# Patient Record
Sex: Male | Born: 1955 | Race: White | Hispanic: No | Marital: Single | Smoking: Current every day smoker
Health system: Southern US, Community
[De-identification: ages and names within clinical notes are randomized; demographics above are authoritative.]

## PROBLEM LIST (undated history)

## (undated) DIAGNOSIS — I10 Essential (primary) hypertension: Secondary | ICD-10-CM

---

## 1998-12-15 ENCOUNTER — Emergency Department (HOSPITAL_COMMUNITY): Admission: EM | Admit: 1998-12-15 | Discharge: 1998-12-15 | Payer: Self-pay | Admitting: Emergency Medicine

## 2008-10-22 ENCOUNTER — Emergency Department (HOSPITAL_COMMUNITY): Admission: EM | Admit: 2008-10-22 | Discharge: 2008-10-22 | Payer: Self-pay | Admitting: Emergency Medicine

## 2010-07-26 LAB — URINALYSIS, ROUTINE W REFLEX MICROSCOPIC
Bilirubin Urine: NEGATIVE
Ketones, ur: NEGATIVE mg/dL
Protein, ur: NEGATIVE mg/dL
Urobilinogen, UA: 0.2 mg/dL (ref 0.0–1.0)

## 2010-07-26 LAB — POCT I-STAT, CHEM 8
BUN: 16 mg/dL (ref 6–23)
Calcium, Ion: 1.03 mmol/L — ABNORMAL LOW (ref 1.12–1.32)
Chloride: 99 mEq/L (ref 96–112)
Creatinine, Ser: 1.1 mg/dL (ref 0.4–1.5)
Glucose, Bld: 115 mg/dL — ABNORMAL HIGH (ref 70–99)
HCT: 44 % (ref 39.0–52.0)
Hemoglobin: 15 g/dL (ref 13.0–17.0)
Potassium: 4.1 mEq/L (ref 3.5–5.1)
Sodium: 137 mEq/L (ref 135–145)
TCO2: 29 mmol/L (ref 0–100)

## 2010-07-26 LAB — URINE MICROSCOPIC-ADD ON

## 2013-02-22 ENCOUNTER — Other Ambulatory Visit (HOSPITAL_COMMUNITY): Payer: Self-pay | Admitting: Urology

## 2013-03-05 ENCOUNTER — Ambulatory Visit (HOSPITAL_COMMUNITY)
Admission: RE | Admit: 2013-03-05 | Discharge: 2013-03-05 | Disposition: A | Discharge status: Home / Self Care | Payer: No Typology Code available for payment source | Source: Ambulatory Visit | Attending: Urology | Admitting: Urology

## 2013-03-05 DIAGNOSIS — N289 Disorder of kidney and ureter, unspecified: Secondary | ICD-10-CM | POA: Insufficient documentation

## 2014-06-08 ENCOUNTER — Emergency Department (HOSPITAL_COMMUNITY)
Admission: EM | Admit: 2014-06-08 | Discharge: 2014-06-08 | Disposition: A | Discharge status: Home / Self Care | Payer: PRIVATE HEALTH INSURANCE | Attending: Emergency Medicine | Admitting: Emergency Medicine

## 2014-06-08 ENCOUNTER — Emergency Department (HOSPITAL_COMMUNITY): Payer: PRIVATE HEALTH INSURANCE

## 2014-06-08 ENCOUNTER — Encounter (HOSPITAL_COMMUNITY): Payer: Self-pay | Admitting: *Deleted

## 2014-06-08 DIAGNOSIS — H9201 Otalgia, right ear: Secondary | ICD-10-CM | POA: Insufficient documentation

## 2014-06-08 DIAGNOSIS — I10 Essential (primary) hypertension: Secondary | ICD-10-CM | POA: Insufficient documentation

## 2014-06-08 DIAGNOSIS — Z79899 Other long term (current) drug therapy: Secondary | ICD-10-CM | POA: Insufficient documentation

## 2014-06-08 DIAGNOSIS — R05 Cough: Secondary | ICD-10-CM

## 2014-06-08 DIAGNOSIS — J0100 Acute maxillary sinusitis, unspecified: Secondary | ICD-10-CM

## 2014-06-08 DIAGNOSIS — R059 Cough, unspecified: Secondary | ICD-10-CM

## 2014-06-08 DIAGNOSIS — Z720 Tobacco use: Secondary | ICD-10-CM | POA: Insufficient documentation

## 2014-06-08 DIAGNOSIS — Z7951 Long term (current) use of inhaled steroids: Secondary | ICD-10-CM | POA: Insufficient documentation

## 2014-06-08 HISTORY — DX: Essential (primary) hypertension: I10

## 2014-06-08 MED ORDER — AMOXICILLIN 500 MG PO CAPS: 500.0000 mg | ORAL_CAPSULE | Freq: Three times a day (TID) | ORAL | Status: AC

## 2014-06-08 NOTE — ED Notes (Signed)
The pt is allergic to beta bkockers

## 2014-06-08 NOTE — Discharge Instructions (Signed)
Take amoxicillin as directed until gone. Refer to attached documents for more information. Return to the ED with worsening or concerning symptoms.  °

## 2014-06-08 NOTE — ED Provider Notes (Signed)
CSN: [id number]     Arrival date & time 06/08/14  0039 History   First MD Initiated Contact with Patient 06/08/14 0256     Chief Complaint  Patient presents with  . Cough     (Consider location/radiation/quality/duration/timing/severity/associated sxs/prior Treatment) Patient is a 59 y.o. male presenting with cough. The history is provided by the patient. No language interpreter was used.  Cough Cough characteristics:  Hacking Severity:  Moderate Onset quality:  Gradual Duration:  2 days Timing:  Constant Progression:  Worsening Chronicity:  New Smoker: yes   Context: not animal exposure, not exposure to allergens, not fumes, not occupational exposure, not sick contacts, not smoke exposure, not upper respiratory infection, not weather changes and not with activity   Relieved by:  Nothing Worsened by:  Nothing tried Associated symptoms: ear pain and sinus congestion   Associated symptoms: no chest pain, no chills, no fever, no myalgias, no rash, no rhinorrhea, no shortness of breath, no sore throat, no weight loss and no wheezing   Risk factors: no chemical exposure, no recent infection and no recent travel     Past Medical History  Diagnosis Date  . Hypertension    History reviewed. No pertinent past surgical history. No family history on file. History  Substance Use Topics  . Smoking status: Current Every Day Smoker  . Smokeless tobacco: Not on file  . Alcohol Use: No    Review of Systems  Constitutional: Negative for fever, chills, weight loss and fatigue.  HENT: Positive for congestion, ear pain and sinus pressure. Negative for rhinorrhea, sore throat and trouble swallowing.   Eyes: Negative for visual disturbance.  Respiratory: Positive for cough. Negative for shortness of breath and wheezing.   Cardiovascular: Negative for chest pain and palpitations.  Gastrointestinal: Negative for nausea, vomiting, abdominal pain and diarrhea.  Genitourinary: Negative for  dysuria and difficulty urinating.  Musculoskeletal: Negative for myalgias, arthralgias and neck pain.  Skin: Negative for color change and rash.  Neurological: Negative for dizziness and weakness.  Psychiatric/Behavioral: Negative for dysphoric mood.      Allergies  Beta adrenergic blockers  Home Medications   Prior to Admission medications   Medication Sig Start Date End Date Taking? Authorizing Provider  albuterol (PROVENTIL HFA;VENTOLIN HFA) 108 (90 BASE) MCG/ACT inhaler Inhale 2 puffs into the lungs every 6 (six) hours as needed for wheezing or shortness of breath.   Yes Historical Provider, MD  amLODipine (NORVASC) 10 MG tablet Take 10 mg by mouth daily.   Yes Historical Provider, MD  Budesonide-Formoterol Fumarate (SYMBICORT IN) Inhale 2 puffs into the lungs 2 (two) times daily.   Yes Historical Provider, MD  hydrochlorothiazide (HYDRODIURIL) 25 MG tablet Take 25 mg by mouth every morning.    Yes Historical Provider, MD   BP 144/79 mmHg  Pulse 95  Temp(Src) 98.7 F (37.1 C)  Resp 20  Ht  (1.803 m)  Wt 247 lb (112.038 kg)  BMI 34.46 kg/m2  SpO2 96% Physical Exam  Constitutional: He is oriented to person, place, and time. He appears well-developed and well-nourished. No distress.  HENT:  Head: Normocephalic and atraumatic.  Right Ear: External ear normal.  Left Ear: External ear normal.  Mouth/Throat: Oropharynx is clear and moist. No oropharyngeal exudate.  Maxillary sinus tenderness to palpation.   Eyes: Conjunctivae and EOM are normal.  Neck: Normal range of motion.  Cardiovascular: Normal rate and regular rhythm.  Exam reveals no gallop and no friction rub.   No murmur heard.  Pulmonary/Chest: Effort normal and breath sounds normal. He has no wheezes. He has no rales. He exhibits no tenderness.  Abdominal: Soft. He exhibits no distension. There is no tenderness. There is no rebound.  Musculoskeletal: Normal range of motion.  Neurological: He is alert and  oriented to person, place, and time. Coordination normal.  Speech is goal-oriented. Moves limbs without ataxia.   Skin: Skin is warm and dry.  Psychiatric: He has a normal mood and affect. His behavior is normal.  Nursing note and vitals reviewed.   ED Course  Procedures (including critical care time) Labs Review Labs Reviewed - No data to display  Imaging Review Dg Chest 2 View  06/08/2014   CLINICAL DATA:  Sleep apnea for 2 weeks, cough and cold for 2 days with sinus infection.  EXAM: CHEST  2 VIEW  COMPARISON:  None.  FINDINGS: Cardiomediastinal silhouette is unremarkable. The lungs are clear without pleural effusions or focal consolidations. Trachea projects midline and there is no pneumothorax. Soft tissue planes and included osseous structures are non-suspicious. Mild degenerative change of the thoracic spine.  IMPRESSION: Normal chest.   Electronically Signed   By: Awilda Metroourtnay  Bloomer   On: 06/08/2014 03:48     EKG Interpretation None      MDM   Final diagnoses:  Cough  Acute maxillary sinusitis, recurrence not specified    4:29 AM Chest xray unremarkable for acute changes. Vitals stable and patient afebrile. Patient will have amoxicillin for sinusitis. Patient advised to follow up with PCP.    [address] August[address] StreetKaitlyn ProsserSzekalski, PA-C 06/09/14 0534  Loren Raceravid Yelverton, MD 06/09/14 0700

## 2014-06-08 NOTE — ED Notes (Signed)
The pt has had a cold and a cough for 2 days .  He also has sinus  Congestion and chest congestion and he has rt ear pain

## 2014-09-12 IMAGING — US US RENAL
1 series · 14 of 25 positions shown · non-contrast
Comparison: None

Correlation:  CT abdomen 10/22/2008

CLINICAL DATA: Large left cystic renal mass

EXAM:
RENAL/URINARY TRACT ULTRASOUND COMPLETE

[Series 1: us renal · 0.25mm/px · 14 of 64 slices shown]
[im 1/64]
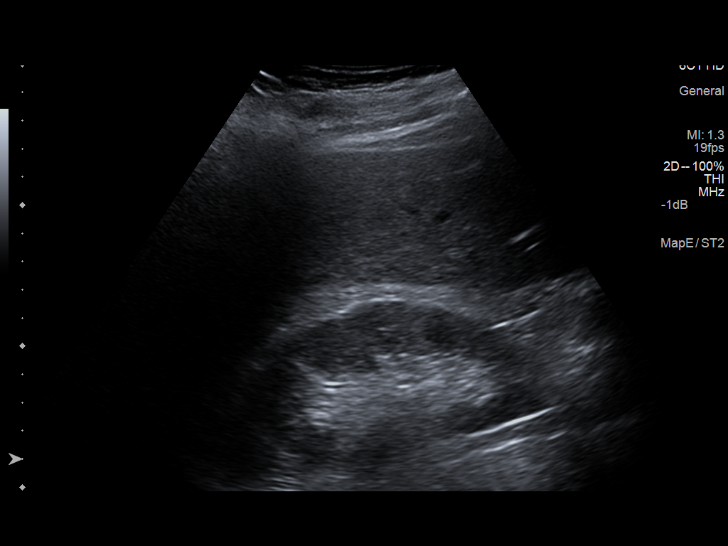
[im 6/64]
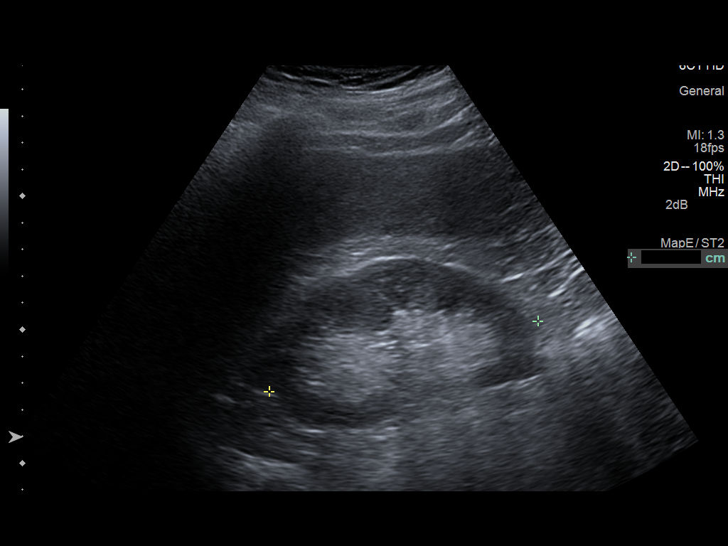
[im 11/64]
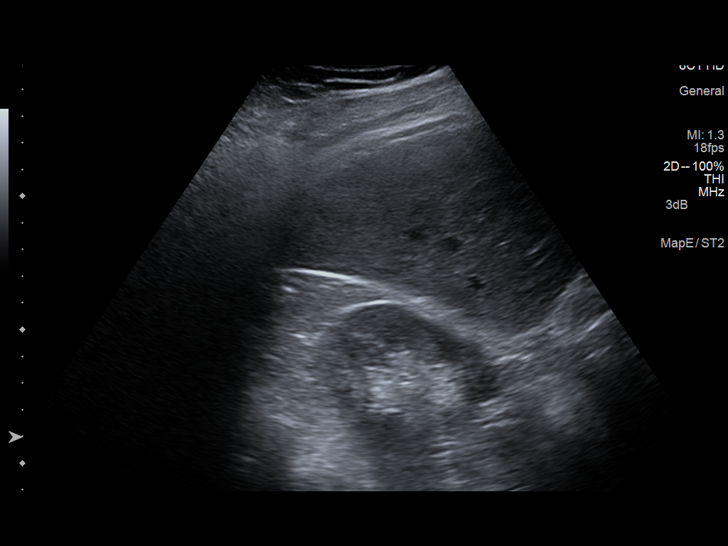
[im 16/64]
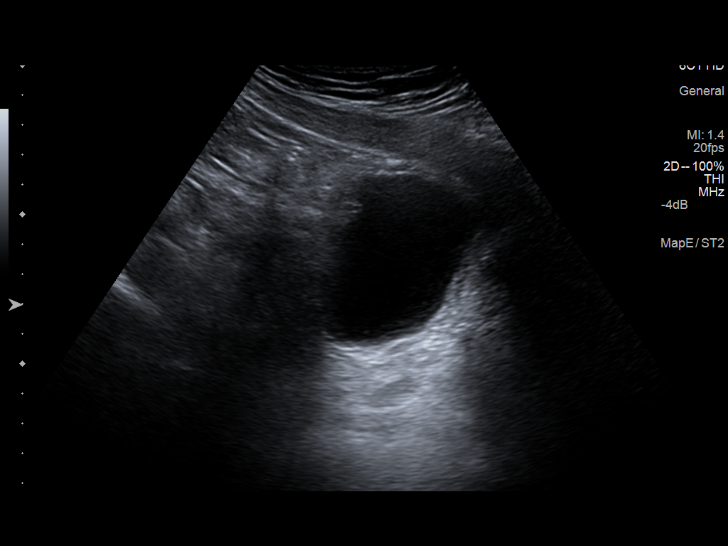
[im 22/64]
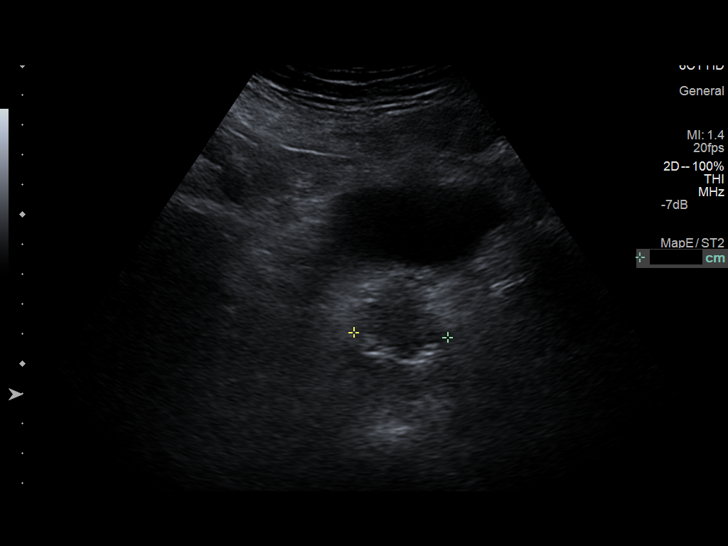
[im 24/64]
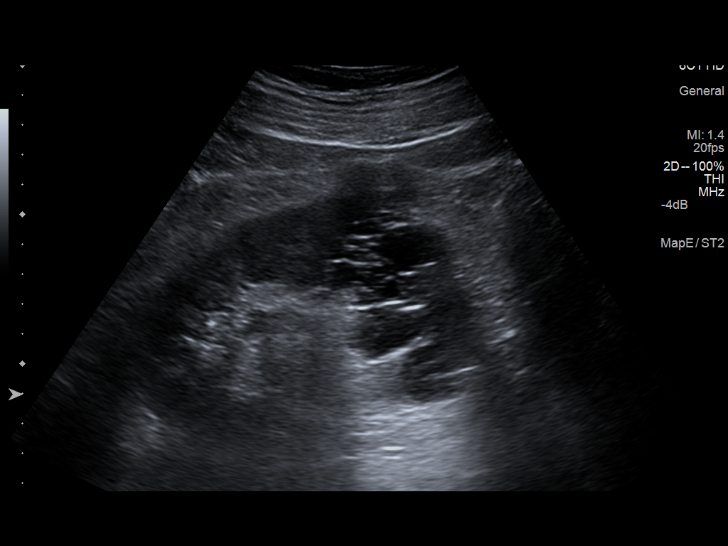
[im 29/64]
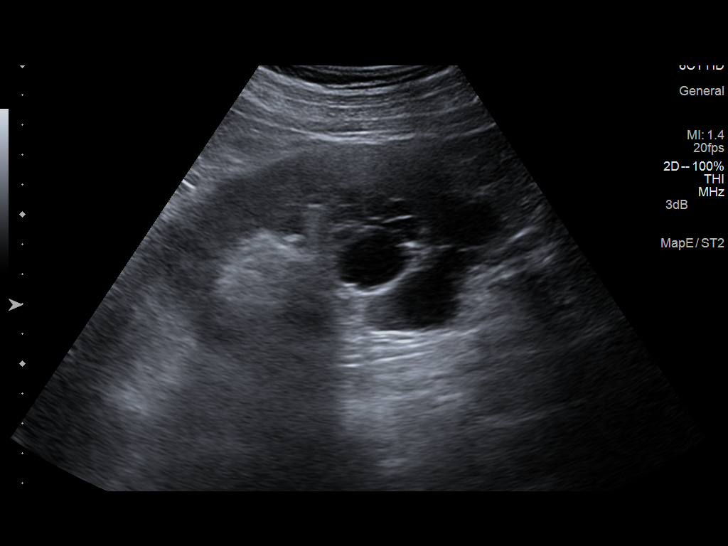
[im 35/64]
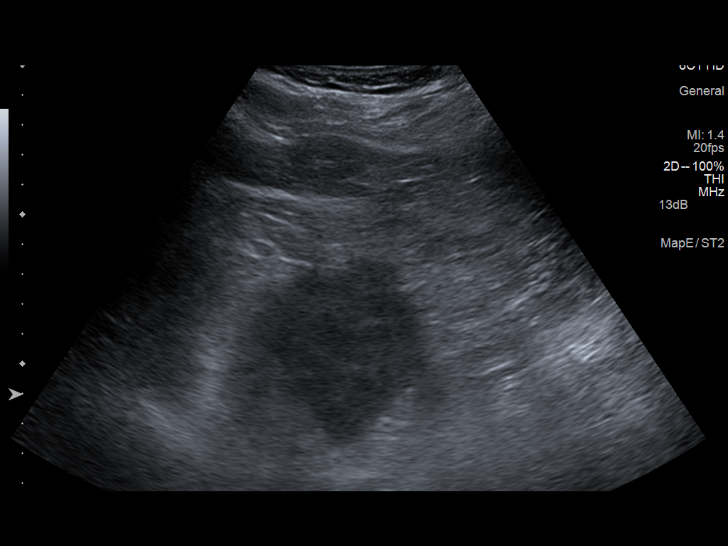
[im 40/64]
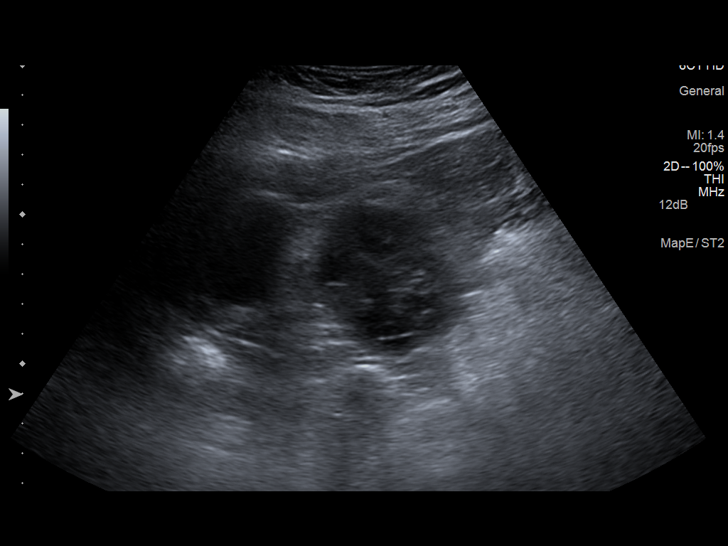
[im 43/64]
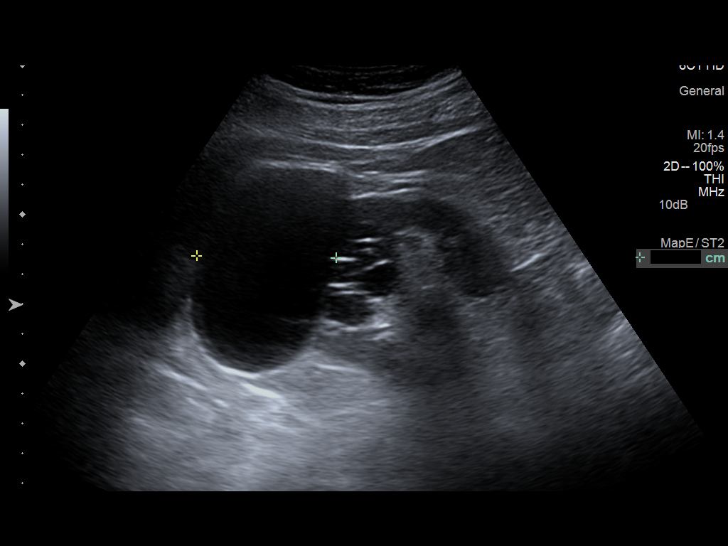
[im 48/64]
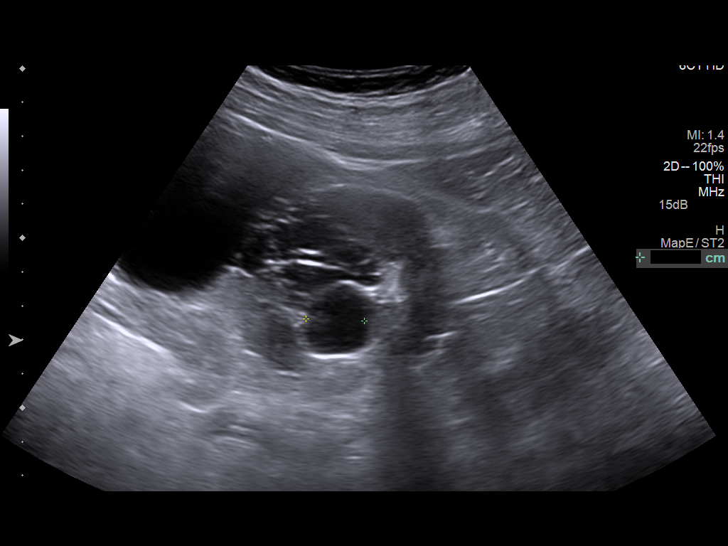
[im 53/64]
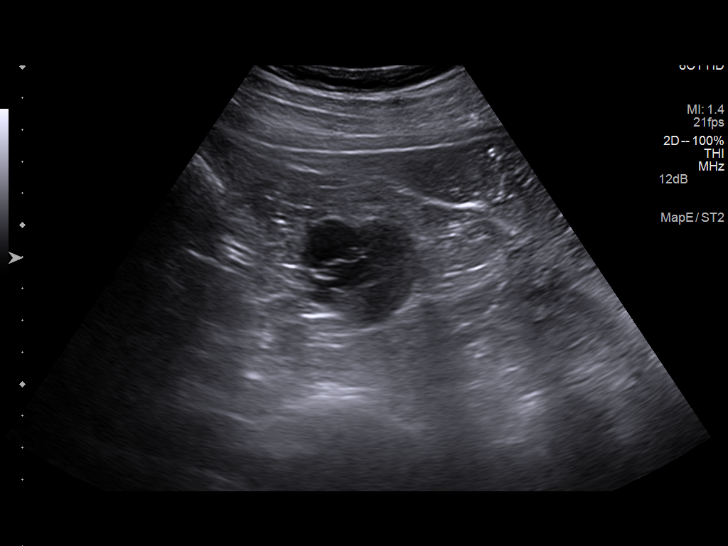
[im 58/64]
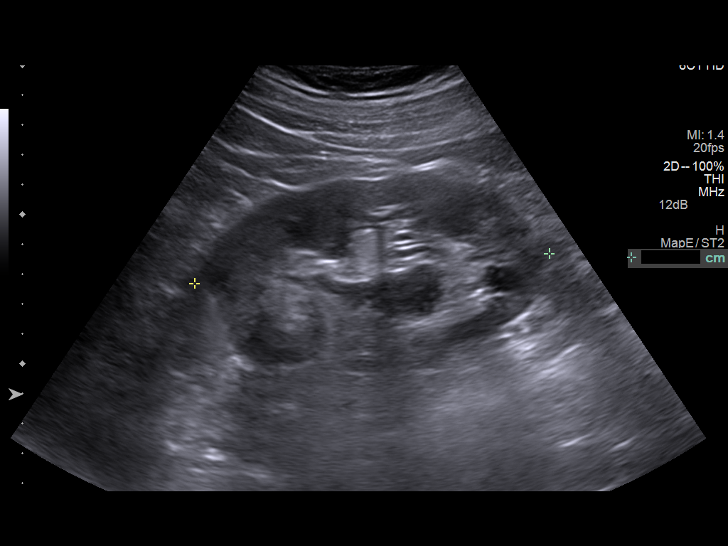
[im 64/64]
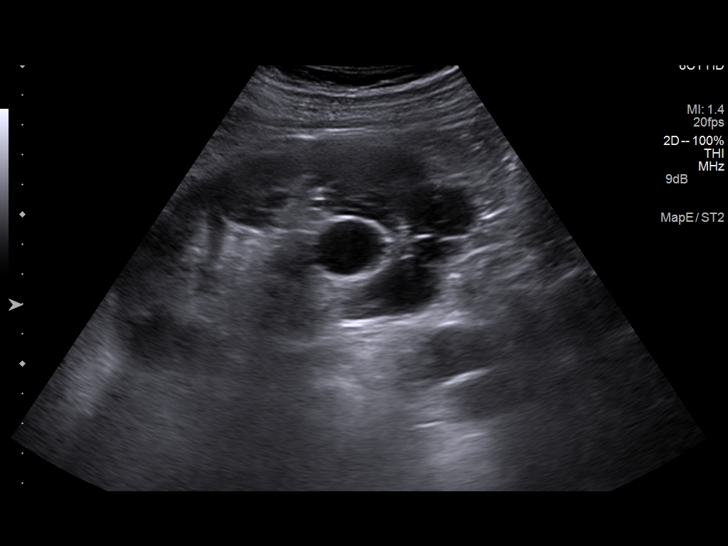

[14 of 25 positions shown; findings below may reference images not displayed]

FINDINGS: Right Kidney

Length: 10.8 cm. Normal cortical thickness and echogenicity. No
mass, hydronephrosis or shadowing calcification. No perinephric
fluid.

Left Kidney

Length: 12.3 cm. Normal cortical thickness and echogenicity.
Minimally complicated cyst mid left kidney 6.3 cm greatest size
containing partial septations at margin immediately adjacent however
is a complex cystic area at the mid inferior left kidney, question
related to above cystic mass versus upper process, area overall
measuring 4.3 cm in greatest size. Several additional small cystic
components are noted. No gross hydronephrosis or shadowing
calcification.

Bladder

Appears normal for degree of bladder distention. Bilateral ureteral
jets visualized. Prostate gland 4.4 x 4.0 x 3.4 cm.
IMPRESSION: Complex cystic mass/process at the mid inferior left kidney with a
single cystic component measuring at least 6.3 cm and a more complex
cystic area at least 4.3 cm in size.

Further characterization by MR imaging with and without contrast
recommended.

## 2015-12-16 IMAGING — CR DG CHEST 2V
2 series · 2 of 2 positions shown · non-contrast
Comparison: None.

CLINICAL DATA: Sleep apnea for 2 weeks, cough and cold for 2 days
with sinus infection.

EXAM:
CHEST  2 VIEW

[chest pa]
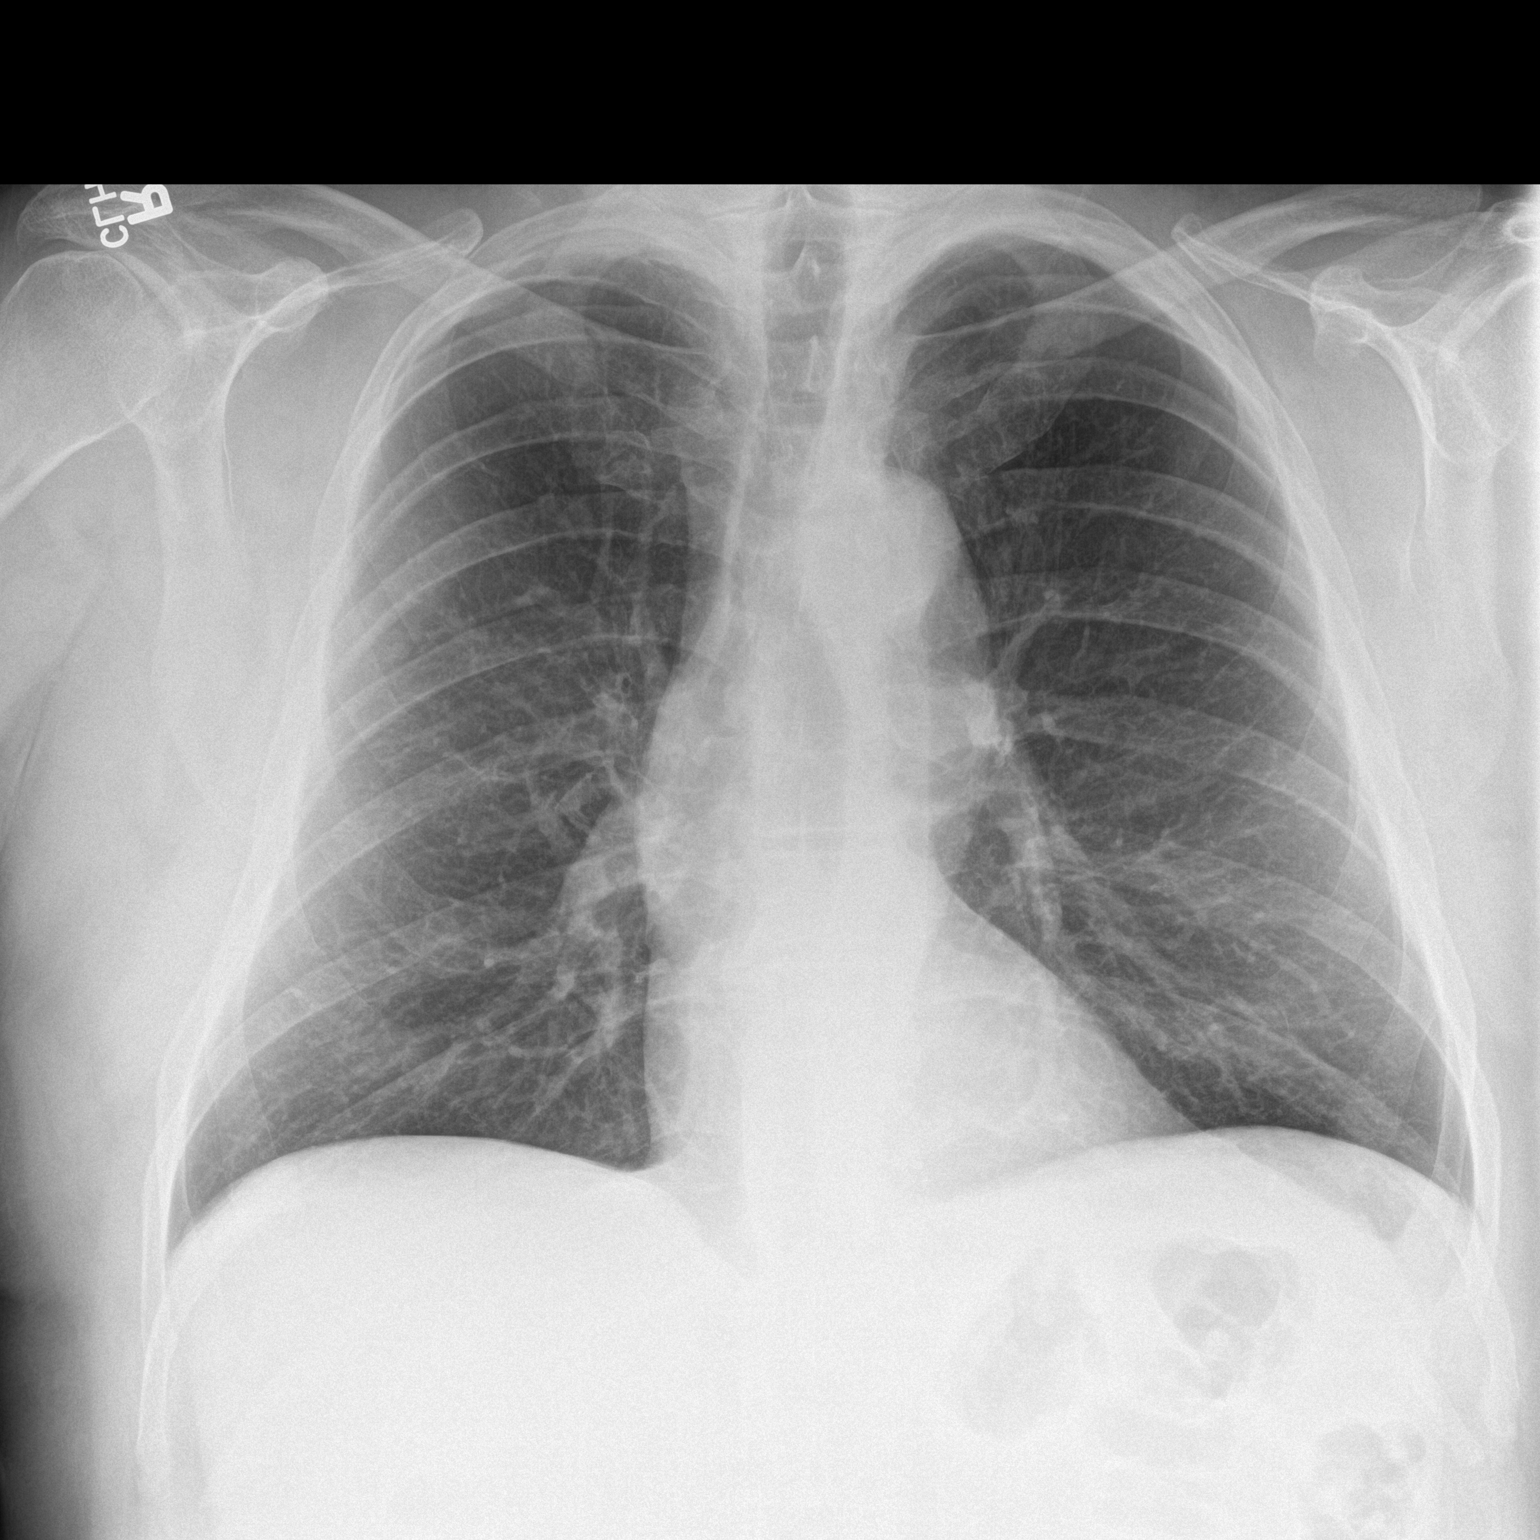

[chest lat]
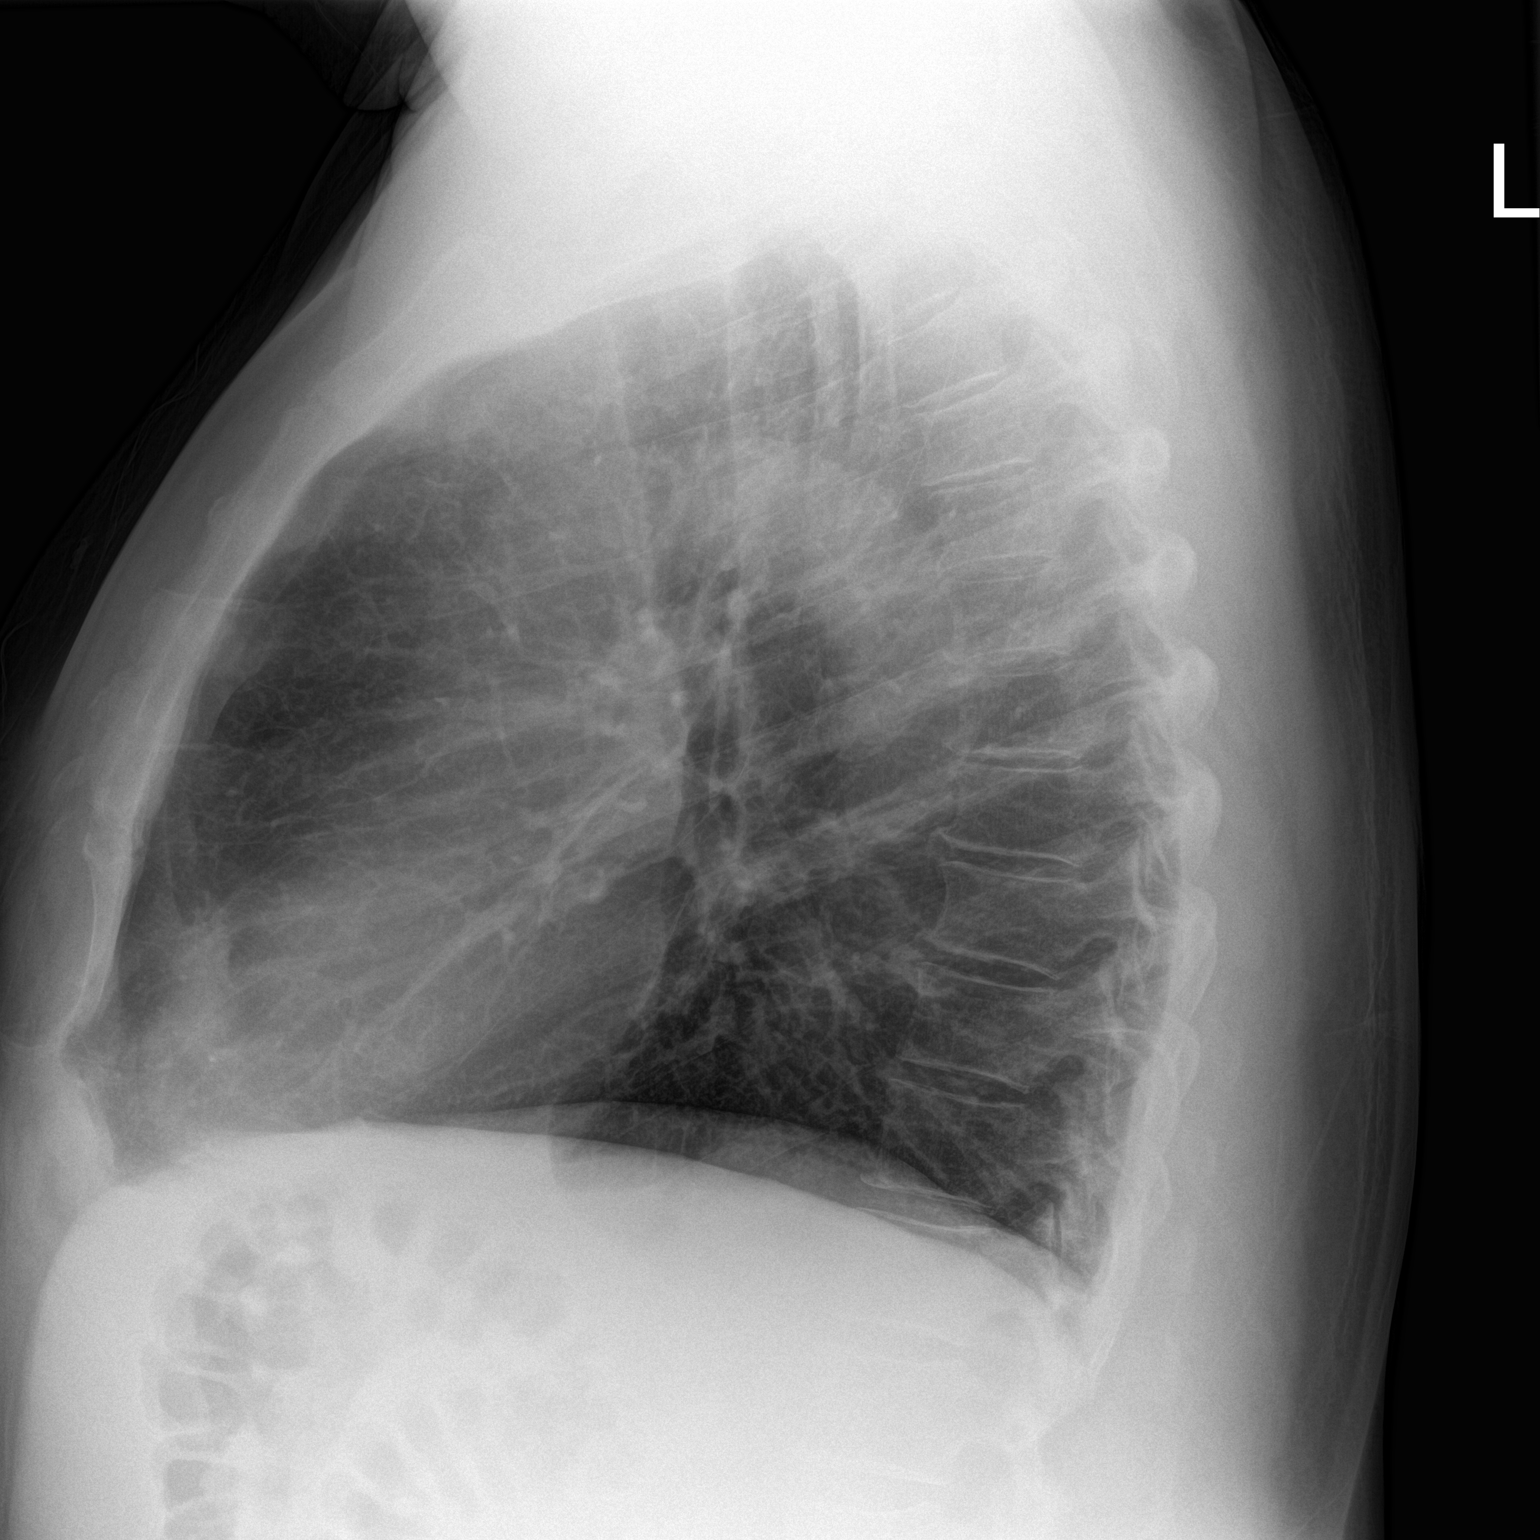

[2 of 2 positions shown; findings below may reference images not displayed]

FINDINGS: Cardiomediastinal silhouette is unremarkable. The lungs are clear
without pleural effusions or focal consolidations. Trachea projects
midline and there is no pneumothorax. Soft tissue planes and
included osseous structures are non-suspicious. Mild degenerative
change of the thoracic spine.
IMPRESSION: Normal chest.

  By: Jebri Fehem
